# Patient Record
Sex: Male | Born: 1995
Health system: Southern US, Community
[De-identification: ages and names within clinical notes are randomized; demographics above are authoritative.]

## PROBLEM LIST (undated history)

## (undated) DIAGNOSIS — F909 Attention-deficit hyperactivity disorder, unspecified type: Secondary | ICD-10-CM

## (undated) DIAGNOSIS — Q625 Duplication of ureter: Secondary | ICD-10-CM

## (undated) HISTORY — PX: EYE MUSCLE SURGERY: SHX370

## (undated) HISTORY — PX: KNEE SURGERY: SHX244

---
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
## 2017-08-07 ENCOUNTER — Other Ambulatory Visit: Payer: Self-pay

## 2017-08-07 ENCOUNTER — Encounter (HOSPITAL_BASED_OUTPATIENT_CLINIC_OR_DEPARTMENT_OTHER): Payer: Self-pay | Admitting: *Deleted

## 2017-08-07 ENCOUNTER — Emergency Department (HOSPITAL_BASED_OUTPATIENT_CLINIC_OR_DEPARTMENT_OTHER): Payer: 59

## 2017-08-07 ENCOUNTER — Emergency Department (HOSPITAL_BASED_OUTPATIENT_CLINIC_OR_DEPARTMENT_OTHER)
Admission: EM | Admit: 2017-08-07 | Discharge: 2017-08-07 | Disposition: A | Discharge status: Home / Self Care | Payer: 59 | Attending: Emergency Medicine | Admitting: Emergency Medicine

## 2017-08-07 DIAGNOSIS — R1011 Right upper quadrant pain: Secondary | ICD-10-CM

## 2017-08-07 DIAGNOSIS — R112 Nausea with vomiting, unspecified: Secondary | ICD-10-CM | POA: Diagnosis not present

## 2017-08-07 DIAGNOSIS — Z79899 Other long term (current) drug therapy: Secondary | ICD-10-CM | POA: Diagnosis not present

## 2017-08-07 DIAGNOSIS — R079 Chest pain, unspecified: Secondary | ICD-10-CM

## 2017-08-07 HISTORY — DX: Attention-deficit hyperactivity disorder, unspecified type: F90.9

## 2017-08-07 HISTORY — DX: Duplication of ureter: Q62.5

## 2017-08-07 LAB — COMPREHENSIVE METABOLIC PANEL
ALK PHOS: 69 U/L (ref 38–126)
ALT: 43 U/L (ref 17–63)
AST: 28 U/L (ref 15–41)
Albumin: 4.7 g/dL (ref 3.5–5.0)
Anion gap: 8 (ref 5–15)
BILIRUBIN TOTAL: 0.7 mg/dL (ref 0.3–1.2)
BUN: 12 mg/dL (ref 6–20)
CALCIUM: 10 mg/dL (ref 8.9–10.3)
CO2: 28 mmol/L (ref 22–32)
CREATININE: 0.91 mg/dL (ref 0.61–1.24)
Chloride: 100 mmol/L — ABNORMAL LOW (ref 101–111)
Glucose, Bld: 93 mg/dL (ref 65–99)
Potassium: 4.1 mmol/L (ref 3.5–5.1)
SODIUM: 136 mmol/L (ref 135–145)
Total Protein: 8 g/dL (ref 6.5–8.1)

## 2017-08-07 LAB — CBC WITH DIFFERENTIAL/PLATELET
Basophils Absolute: 0 10*3/uL (ref 0.0–0.1)
Basophils Relative: 0 %
Eosinophils Absolute: 0.1 10*3/uL (ref 0.0–0.7)
Eosinophils Relative: 1 %
HEMATOCRIT: 46 % (ref 39.0–52.0)
HEMOGLOBIN: 15.9 g/dL (ref 13.0–17.0)
LYMPHS ABS: 3.1 10*3/uL (ref 0.7–4.0)
LYMPHS PCT: 27 %
MCH: 27.6 pg (ref 26.0–34.0)
MCHC: 34.6 g/dL (ref 30.0–36.0)
MCV: 79.7 fL (ref 78.0–100.0)
Monocytes Absolute: 1 10*3/uL (ref 0.1–1.0)
Monocytes Relative: 9 %
NEUTROS ABS: 7.3 10*3/uL (ref 1.7–7.7)
NEUTROS PCT: 63 %
Platelets: 250 10*3/uL (ref 150–400)
RBC: 5.77 MIL/uL (ref 4.22–5.81)
RDW: 13.4 % (ref 11.5–15.5)
WBC: 11.5 10*3/uL — AB (ref 4.0–10.5)

## 2017-08-07 LAB — TROPONIN I

## 2017-08-07 LAB — LIPASE, BLOOD: Lipase: 30 U/L (ref 11–51)

## 2017-08-07 MED ORDER — ONDANSETRON 4 MG PO TBDP: 4.0000 mg | ORAL_TABLET | Freq: Three times a day (TID) | ORAL | 0 refills | Status: DC | PRN

## 2017-08-07 MED ORDER — CYCLOBENZAPRINE HCL 10 MG PO TABS: 10.0000 mg | ORAL_TABLET | Freq: Two times a day (BID) | ORAL | 0 refills | Status: DC | PRN

## 2017-08-07 MED FILL — CYCLOBENZAPRINE 10 MG TAB: 10 | 3 days supply | Qty: 6 | Fill #0

## 2017-08-07 MED FILL — ONDANSETRON ODT 4 MG TABLET: 4 | 2 days supply | Qty: 6 | Fill #0

## 2017-08-07 NOTE — ED Provider Notes (Signed)
[facility] EMERGENCY DEPARTMENT Provider Note   CSN: [id number] Arrival date & time: 08/07/17  0911     History   Chief Complaint Chief Complaint  Patient presents with  . Chest Pain  . Emesis    HPI [name] is a 21 y.o. male.  HPI   Right side lower chest pain, woke up with it yesterday morning and it has been progressively getting worse.  Worse with movements and deep breaths.  Walking around worse.  More difficult sleeping on stomach.  No cough, no shortness of breath but does have pain with breathing.  Nausea and 3 episodes of vomiting.  Hasn't had anything to eat since 1230.  No fevers.  No leg pain or swelling, no immobilization.  [place] to [facility] last weekend.  Occasional etoh.  No drug use or cigarettes.   No family hx of CAD, no fam hx DVT  Past Medical History:  Diagnosis Date  . ADHD   . Duplicated renal collecting system     There are no active problems to display for this patient.   Past Surgical History:  Procedure Laterality Date  . EYE MUSCLE SURGERY Bilateral   . KNEE SURGERY Right        Home Medications    Prior to Admission medications   Medication Sig Start Date End Date Taking? Authorizing Provider  amphetamine-dextroamphetamine (ADDERALL XR) 30 MG 24 hr capsule Take 30 mg daily by mouth.   Yes [provider]  cyclobenzaprine (FLEXERIL) 10 MG tablet Take 1 tablet (10 mg total) 2 (two) times daily as needed by mouth for muscle spasms. 08/07/17   Alvira MondaySchlossman, Stancil Deisher, MD  ondansetron (ZOFRAN ODT) 4 MG disintegrating tablet Take 1 tablet (4 mg total) every 8 (eight) hours as needed by mouth for nausea or vomiting. 08/07/17   Alvira MondaySchlossman, Cyril Railey, MD    Family History No family history on file.  Social History Social History   Tobacco Use  . Smoking status: Never Smoker  . Smokeless tobacco: Never Used  Substance Use Topics  . Alcohol use: Yes    Comment: occ  . Drug use: Not on file     Allergies    Oxycodone   Review of Systems Review of Systems  Constitutional: Negative for fever.  HENT: Negative for sore throat.   Eyes: Negative for visual disturbance.  Respiratory: Negative for shortness of breath.   Cardiovascular: Positive for chest pain.  Gastrointestinal: Positive for abdominal pain, nausea and vomiting.  Genitourinary: Negative for difficulty urinating and dysuria.  Musculoskeletal: Negative for back pain and neck stiffness.  Skin: Negative for rash.  Neurological: Negative for syncope and headaches.     Physical Exam Updated Vital Signs BP 136/83   Pulse 73   Temp 98.5 F (36.9 C) (Oral)   Resp 14   Ht 6' (1.829 m)   Wt 111.1 kg (245 lb)   SpO2 100%   BMI 33.23 kg/m   Physical Exam  Constitutional: He is oriented to person, place, and time. He appears well-developed and well-nourished. No distress.  HENT:  Head: Normocephalic and atraumatic.  Eyes: Conjunctivae and EOM are normal.  Neck: Normal range of motion.  Cardiovascular: Normal rate, regular rhythm, normal heart sounds and intact distal pulses. Exam reveals no gallop and no friction rub.  No murmur heard. Pulmonary/Chest: Effort normal and breath sounds normal. No respiratory distress. He has no wheezes. He has no rales.  Abdominal: Soft. He exhibits no distension. There is tenderness (epigastric  and RUQ). There is no guarding.  Musculoskeletal: He exhibits no edema.  Neurological: He is alert and oriented to person, place, and time.  Skin: Skin is warm and dry. He is not diaphoretic.  Nursing note and vitals reviewed.    ED Treatments / Results  Labs (all labs ordered are listed, but only abnormal results are displayed) Labs Reviewed  CBC WITH DIFFERENTIAL/PLATELET - Abnormal; Notable for the following components:      Result Value   WBC 11.5 (*)    All other components within normal limits  COMPREHENSIVE METABOLIC PANEL - Abnormal; Notable for the following components:   Chloride 100  (*)    All other components within normal limits  LIPASE, BLOOD  TROPONIN I    EKG  EKG Interpretation  Date/Time:  Thursday August 07 2017 09:42:27 EST Ventricular Rate:  81 PR Interval:    QRS Duration: 91 QT Interval:  335 QTC Calculation: 389 R Axis:   52 Text Interpretation:  Sinus rhythm ST elev, probable normal early repol pattern No previous ECGs available Confirmed by Alvira MondaySchlossman, Lacy Taglieri ([id number]) on 08/07/2017 11:29:45 AM       Radiology Dg Chest 2 View  Result Date: 08/07/2017 CLINICAL DATA:  Onset of anterior right rib pain yesterday. No known injury. EXAM: CHEST  2 VIEW COMPARISON:  None. FINDINGS: Lungs are clear. Heart size is normal. No pneumothorax or pleural fluid. No bony abnormality. IMPRESSION: Negative chest. Electronically Signed   By: Drusilla Kannerhomas  Dalessio M.D.   On: 08/07/2017 10:22   [place] Abdomen Limited Ruq  Result Date: 08/07/2017 CLINICAL DATA:  Onset of right upper quadrant pain yesterday with increasing severity and development of vomiting last night. Patient also reports right lower anterior ribcage pain tender to palpation and related to deep breathing. History of previous kidney surgery. EXAM: ULTRASOUND ABDOMEN LIMITED RIGHT UPPER QUADRANT COMPARISON:  None in PACs FINDINGS: Gallbladder: No gallstones or wall thickening visualized. No sonographic Murphy sign noted by sonographer. Common bile duct: Diameter: 4.5 mm.  Visualization was limited however. Liver: The hepatic echotexture is increased. There is no focal mass or ductal dilation. The surface contour is smooth. Portal vein is patent on color Doppler imaging with normal direction of blood flow towards the liver. IMPRESSION: Normal appearance of the gallbladder and common bile duct. Mildly increased hepatic echotexture most compatible with fatty infiltrative change. Electronically Signed   By: David  SwazilandJordan M.D.   On: 08/07/2017 10:46    Procedures Procedures (including critical care time)  Medications  Ordered in ED Medications - No data to display   Initial Impression / Assessment and Plan / ED Course  I have reviewed the triage vital signs and the nursing notes.  Pertinent labs & imaging results that were available during my care of the patient were reviewed by me and considered in my medical decision making (see chart for details).     21yo male presents with concern for right lower chest and right upper abdominal pain.  He is PERC negative, doubt PE.  Troponin negative, doubt myocarditis or PE. EKG without acute changes.  CXR WNL.  RUQ US shows fatty liver, no other acute changes.  Lipase and transaminases WNL.  Suspect most likely musculoskeletal etiology of pain. Patient discharged in stable condition with understanding of reasons to return.   Final Clinical Impressions(s) / ED Diagnoses   Final diagnoses:  Right upper quadrant abdominal pain  Chest pain, unspecified type    ED Discharge Orders  Ordered    cyclobenzaprine (FLEXERIL) 10 MG tablet  2 times daily PRN     08/07/17 1131    ondansetron (ZOFRAN ODT) 4 MG disintegrating tablet  Every 8 hours PRN     08/07/17 1131       Alvira Monday, MD 08/08/17 1355

## 2017-08-07 NOTE — ED Triage Notes (Signed)
Pt reports waking up yesterday morning with anterior R lower rib cage pain. Reports worse with palpation and deep breathing. Denies known injury/heavy lifting. Denies sob. Reports emesis x2 last night. Denies nausea, diarrhea, fever.

## 2019-02-14 IMAGING — CR DG CHEST 2V
2 series · 2 of 2 positions shown · non-contrast
Comparison: None.

CLINICAL DATA: Onset of anterior right rib pain yesterday. No known
injury.

EXAM:
CHEST  2 VIEW

[w chest pa]
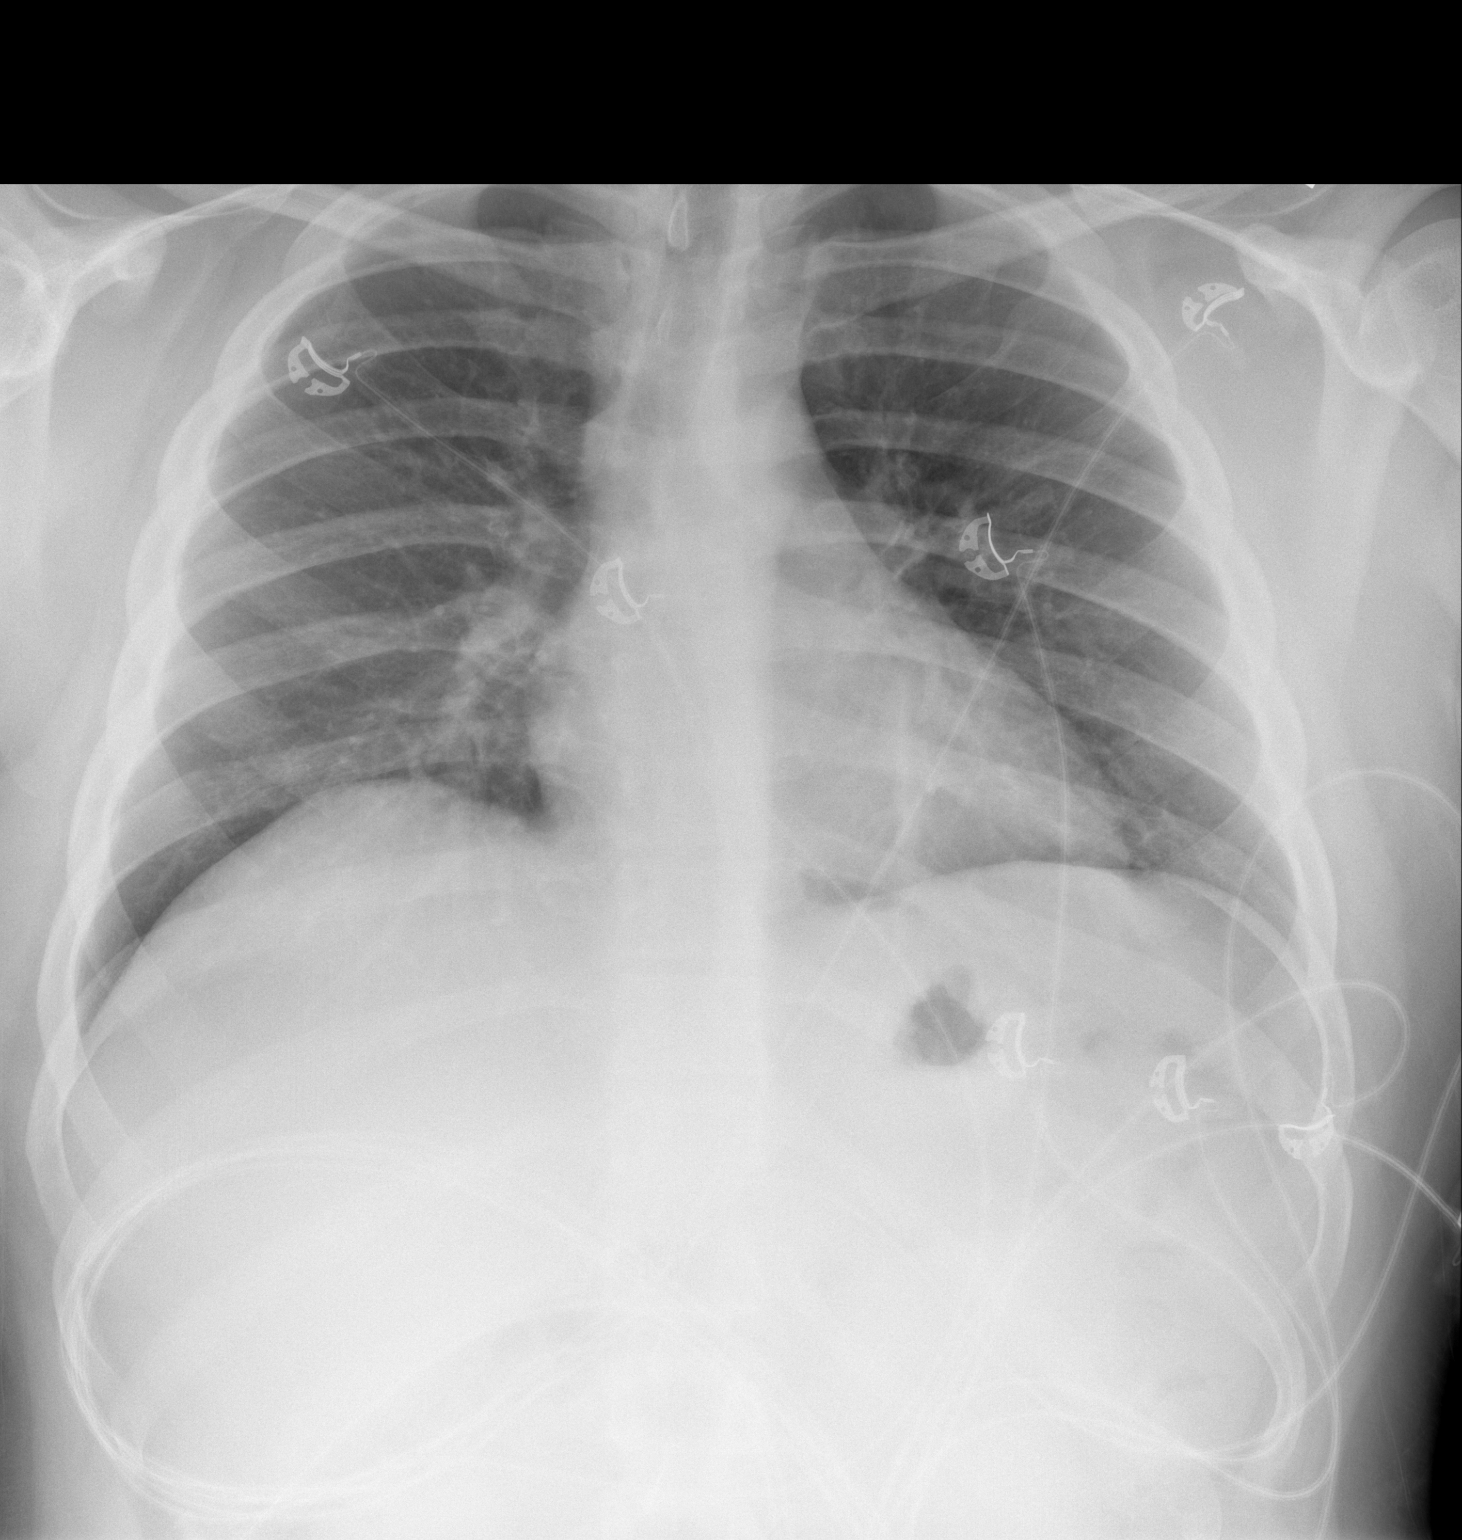

[w chest lat]
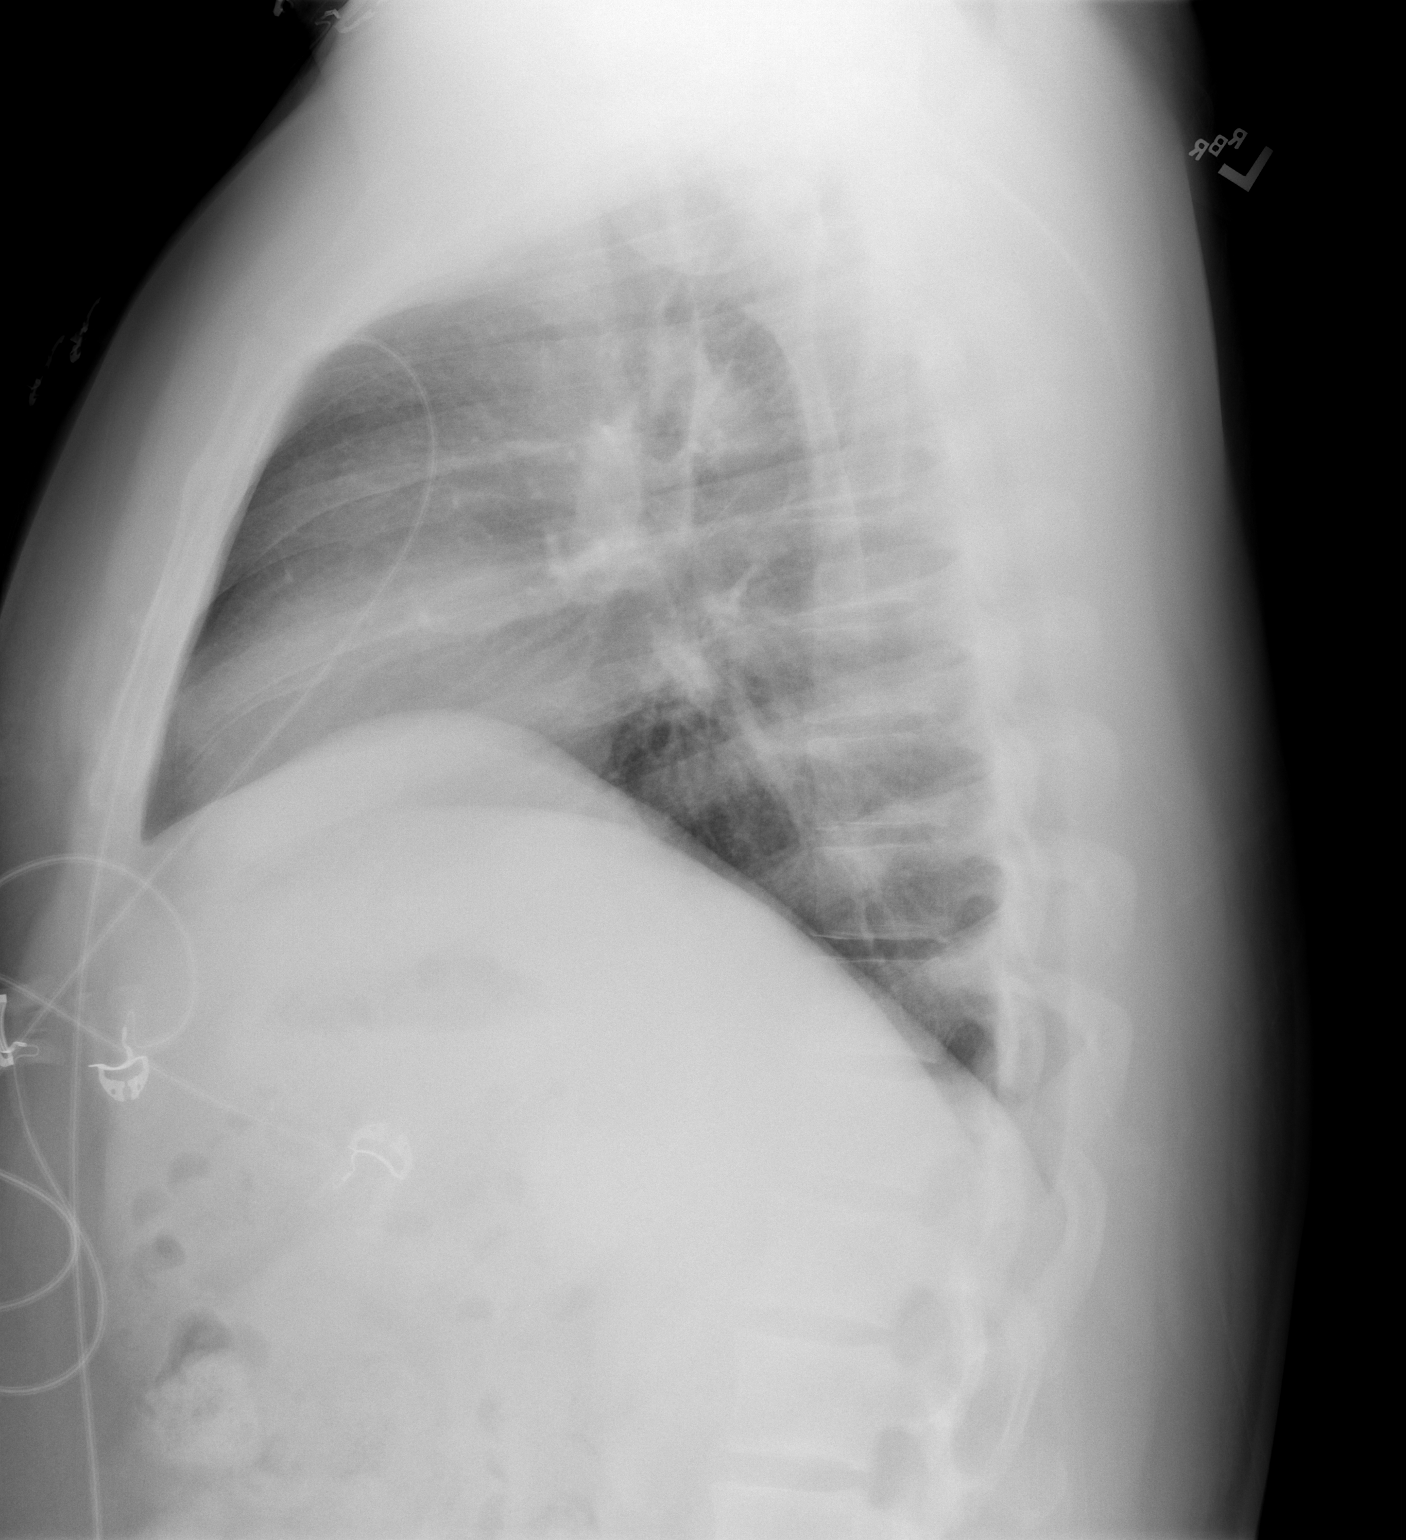

[2 of 2 positions shown; findings below may reference images not displayed]

FINDINGS: Lungs are clear. Heart size is normal. No pneumothorax or pleural
fluid. No bony abnormality.
IMPRESSION: Negative chest.

## 2019-11-12 ENCOUNTER — Ambulatory Visit: Payer: Self-pay | Admitting: Ophthalmology

## 2019-11-19 ENCOUNTER — Encounter (HOSPITAL_BASED_OUTPATIENT_CLINIC_OR_DEPARTMENT_OTHER): Payer: Self-pay | Admitting: Ophthalmology

## 2019-11-19 ENCOUNTER — Other Ambulatory Visit: Payer: Self-pay

## 2019-11-23 ENCOUNTER — Other Ambulatory Visit (HOSPITAL_COMMUNITY): Payer: 59

## 2019-11-26 ENCOUNTER — Other Ambulatory Visit: Payer: Self-pay

## 2019-11-26 ENCOUNTER — Ambulatory Visit (HOSPITAL_BASED_OUTPATIENT_CLINIC_OR_DEPARTMENT_OTHER): Payer: 59 | Admitting: Anesthesiology

## 2019-11-26 ENCOUNTER — Encounter (HOSPITAL_BASED_OUTPATIENT_CLINIC_OR_DEPARTMENT_OTHER)
Admission: RE | Disposition: A | Discharge status: Home / Self Care | Payer: Self-pay | Source: Home / Self Care | Attending: Ophthalmology

## 2019-11-26 ENCOUNTER — Encounter (HOSPITAL_BASED_OUTPATIENT_CLINIC_OR_DEPARTMENT_OTHER): Payer: Self-pay | Admitting: Ophthalmology

## 2019-11-26 ENCOUNTER — Ambulatory Visit (HOSPITAL_BASED_OUTPATIENT_CLINIC_OR_DEPARTMENT_OTHER)
Admission: RE | Admit: 2019-11-26 | Discharge: 2019-11-26 | Disposition: A | Discharge status: Home / Self Care | Payer: 59 | Attending: Ophthalmology | Admitting: Ophthalmology

## 2019-11-26 DIAGNOSIS — Q630 Accessory kidney: Secondary | ICD-10-CM | POA: Insufficient documentation

## 2019-11-26 DIAGNOSIS — F909 Attention-deficit hyperactivity disorder, unspecified type: Secondary | ICD-10-CM | POA: Diagnosis not present

## 2019-11-26 DIAGNOSIS — H5111 Convergence insufficiency: Secondary | ICD-10-CM | POA: Insufficient documentation

## 2019-11-26 DIAGNOSIS — H5010 Unspecified exotropia: Secondary | ICD-10-CM | POA: Insufficient documentation

## 2019-11-26 HISTORY — PX: STRABISMUS SURGERY: SHX218

## 2019-11-26 SURGERY — REPAIR STRABISMUS
Anesthesia: General | Site: Eye | Laterality: Bilateral

## 2019-11-26 MED ORDER — DEXAMETHASONE SODIUM PHOSPHATE 10 MG/ML IJ SOLN
INTRAMUSCULAR | Status: AC
  Filled 2019-11-26: qty 1

## 2019-11-26 MED ORDER — DEXAMETHASONE SODIUM PHOSPHATE 4 MG/ML IJ SOLN
INTRAMUSCULAR | Status: DC | PRN
  Administered 2019-11-26: 10 mg via INTRAVENOUS

## 2019-11-26 MED ORDER — LACTATED RINGERS IV SOLN: INTRAVENOUS | Status: DC

## 2019-11-26 MED ORDER — EPHEDRINE 5 MG/ML INJ
INTRAVENOUS | Status: AC
  Filled 2019-11-26: qty 10

## 2019-11-26 MED ORDER — PROPOFOL 500 MG/50ML IV EMUL
INTRAVENOUS | Status: AC
  Filled 2019-11-26: qty 50

## 2019-11-26 MED ORDER — FENTANYL CITRATE (PF) 100 MCG/2ML IJ SOLN
INTRAMUSCULAR | Status: AC
  Filled 2019-11-26: qty 2

## 2019-11-26 MED ORDER — KETOROLAC TROMETHAMINE 30 MG/ML IJ SOLN
INTRAMUSCULAR | Status: DC | PRN
  Administered 2019-11-26: 30 mg via INTRAVENOUS

## 2019-11-26 MED ORDER — ONDANSETRON HCL 4 MG/2ML IJ SOLN
INTRAMUSCULAR | Status: DC | PRN
  Administered 2019-11-26: 4 mg via INTRAVENOUS

## 2019-11-26 MED ORDER — PROMETHAZINE HCL 25 MG/ML IJ SOLN: 6.2500 mg | INTRAMUSCULAR | Status: DC | PRN

## 2019-11-26 MED ORDER — ACETAMINOPHEN 10 MG/ML IV SOLN
1000.0000 mg | Freq: Once | INTRAVENOUS | Status: DC | PRN
  Administered 2019-11-26: 1000 mg via INTRAVENOUS

## 2019-11-26 MED ORDER — FENTANYL CITRATE (PF) 100 MCG/2ML IJ SOLN
INTRAMUSCULAR | Status: DC | PRN
  Administered 2019-11-26: 100 ug via INTRAVENOUS
  Administered 2019-11-26 (×2): 50 ug via INTRAVENOUS

## 2019-11-26 MED ORDER — PHENYLEPHRINE 40 MCG/ML (10ML) SYRINGE FOR IV PUSH (FOR BLOOD PRESSURE SUPPORT)
PREFILLED_SYRINGE | INTRAVENOUS | Status: AC
  Filled 2019-11-26: qty 10

## 2019-11-26 MED ORDER — ACETAMINOPHEN 10 MG/ML IV SOLN
INTRAVENOUS | Status: AC
  Filled 2019-11-26: qty 100

## 2019-11-26 MED ORDER — LIDOCAINE HCL (CARDIAC) PF 100 MG/5ML IV SOSY
PREFILLED_SYRINGE | INTRAVENOUS | Status: DC | PRN
  Administered 2019-11-26: 80 mg via INTRAVENOUS

## 2019-11-26 MED ORDER — TOBRAMYCIN-DEXAMETHASONE 0.3-0.1 % OP OINT
TOPICAL_OINTMENT | OPHTHALMIC | Status: DC | PRN
  Administered 2019-11-26: 1 via OPHTHALMIC

## 2019-11-26 MED ORDER — FENTANYL CITRATE (PF) 100 MCG/2ML IJ SOLN
25.0000 ug | INTRAMUSCULAR | Status: DC | PRN
  Administered 2019-11-26: 50 ug via INTRAVENOUS

## 2019-11-26 MED ORDER — IBUPROFEN 800 MG PO TABS
800.0000 mg | ORAL_TABLET | Freq: Once | ORAL | Status: AC
  Administered 2019-11-26: 800 mg via ORAL

## 2019-11-26 MED ORDER — GLYCOPYRROLATE 0.2 MG/ML IJ SOLN
INTRAMUSCULAR | Status: DC | PRN
  Administered 2019-11-26: .2 mg via INTRAVENOUS

## 2019-11-26 MED ORDER — LIDOCAINE 2% (20 MG/ML) 5 ML SYRINGE
INTRAMUSCULAR | Status: AC
  Filled 2019-11-26: qty 5

## 2019-11-26 MED ORDER — MEPERIDINE HCL 25 MG/ML IJ SOLN: 6.2500 mg | INTRAMUSCULAR | Status: DC | PRN

## 2019-11-26 MED ORDER — IBUPROFEN 200 MG PO TABS
ORAL_TABLET | ORAL | Status: AC
  Filled 2019-11-26: qty 4

## 2019-11-26 MED ORDER — MIDAZOLAM HCL 5 MG/5ML IJ SOLN
INTRAMUSCULAR | Status: DC | PRN
  Administered 2019-11-26: 2 mg via INTRAVENOUS

## 2019-11-26 MED ORDER — KETOROLAC TROMETHAMINE 30 MG/ML IJ SOLN
INTRAMUSCULAR | Status: AC
  Filled 2019-11-26: qty 1

## 2019-11-26 MED ORDER — ONDANSETRON HCL 4 MG/2ML IJ SOLN
INTRAMUSCULAR | Status: AC
  Filled 2019-11-26: qty 2

## 2019-11-26 MED ORDER — ACETAMINOPHEN 160 MG/5ML PO SOLN: 325.0000 mg | Freq: Once | ORAL | Status: DC | PRN

## 2019-11-26 MED ORDER — SUCCINYLCHOLINE CHLORIDE 200 MG/10ML IV SOSY
PREFILLED_SYRINGE | INTRAVENOUS | Status: AC
  Filled 2019-11-26: qty 10

## 2019-11-26 MED ORDER — ACETAMINOPHEN 325 MG PO TABS: 325.0000 mg | ORAL_TABLET | Freq: Once | ORAL | Status: DC | PRN

## 2019-11-26 MED ORDER — MIDAZOLAM HCL 2 MG/2ML IJ SOLN
INTRAMUSCULAR | Status: AC
  Filled 2019-11-26: qty 2

## 2019-11-26 SURGICAL SUPPLY — 33 items
APL SRG 3 HI ABS STRL LF PLS (MISCELLANEOUS) ×1
APL SWBSTK 6 STRL LF DISP (MISCELLANEOUS) ×4
APPLICATOR COTTON TIP 6 STRL (MISCELLANEOUS) ×4 IMPLANT
APPLICATOR COTTON TIP 6IN STRL (MISCELLANEOUS) ×8
APPLICATOR DR MATTHEWS STRL (MISCELLANEOUS) ×2 IMPLANT
BNDG EYE OVAL (GAUZE/BANDAGES/DRESSINGS) IMPLANT
CAUTERY EYE LOW TEMP 1300F FIN (OPHTHALMIC RELATED) IMPLANT
COVER BACK TABLE 60X90IN (DRAPES) ×2 IMPLANT
COVER MAYO STAND STRL (DRAPES) ×2 IMPLANT
COVER WAND RF STERILE (DRAPES) IMPLANT
DRAPE SURG 17X23 STRL (DRAPES) ×4 IMPLANT
DRAPE U-SHAPE 76X120 STRL (DRAPES) ×1 IMPLANT
GLOVE BIO SURGEON STRL SZ 6.5 (GLOVE) ×4 IMPLANT
GLOVE BIOGEL M STRL SZ7.5 (GLOVE) ×2 IMPLANT
GOWN STRL REUS W/ TWL LRG LVL3 (GOWN DISPOSABLE) ×1 IMPLANT
GOWN STRL REUS W/TWL LRG LVL3 (GOWN DISPOSABLE) ×2
GOWN STRL REUS W/TWL XL LVL3 (GOWN DISPOSABLE) ×2 IMPLANT
NS IRRIG 1000ML POUR BTL (IV SOLUTION) ×2 IMPLANT
PACK BASIN DAY SURGERY FS (CUSTOM PROCEDURE TRAY) ×2 IMPLANT
SHEET MEDIUM DRAPE 40X70 STRL (DRAPES) IMPLANT
SPEAR EYE SURG WECK-CEL (MISCELLANEOUS) ×4 IMPLANT
STRIP CLOSURE SKIN 1/4X4 (GAUZE/BANDAGES/DRESSINGS) IMPLANT
SUT 6 0 SILK T G140 8DA (SUTURE) IMPLANT
SUT MERSILENE 6-0 18IN S14 8MM (SUTURE)
SUT PLAIN 6 0 TG1408 (SUTURE) ×1 IMPLANT
SUT SILK 4 0 C 3 735G (SUTURE) IMPLANT
SUT VICRYL 6 0 S 28 (SUTURE) ×2 IMPLANT
SUT VICRYL ABS 6-0 S29 18IN (SUTURE) IMPLANT
SUTURE MERSLN 6-0 18IN S14 8MM (SUTURE) IMPLANT
SYR 10ML LL (SYRINGE) ×2 IMPLANT
SYR TB 1ML LL NO SAFETY (SYRINGE) ×2 IMPLANT
TOWEL GREEN STERILE FF (TOWEL DISPOSABLE) ×2 IMPLANT
TRAY DSU PREP LF (CUSTOM PROCEDURE TRAY) ×2 IMPLANT

## 2019-11-26 NOTE — Interval H&P Note (Signed)
History and Physical Interval Note:  11/26/2019 9:31 AM  Para Skeans  has presented today for surgery, with the diagnosis of EXOTROPIA.  The various methods of treatment have been discussed with the patient and family. After consideration of risks, benefits and other options for treatment, the patient has consented to  Procedure(s): REPAIR STRABISMUS  BOTH EYES (Bilateral) as a surgical intervention.  The patient's history has been reviewed, patient examined, no change in status, stable for surgery.  I have reviewed the patient's chart and labs.  Questions were answered to the patient's satisfaction.     Shara Blazing

## 2019-11-26 NOTE — Anesthesia Procedure Notes (Signed)
Procedure Name: LMA Insertion Date/Time: 11/26/2019 10:03 AM Performed by: Ronnette Hila, CRNA Pre-anesthesia Checklist: Patient identified, Emergency Drugs available, Suction available and Patient being monitored Patient Re-evaluated:Patient Re-evaluated prior to induction Oxygen Delivery Method: Circle system utilized Preoxygenation: Pre-oxygenation with 100% oxygen Induction Type: IV induction Ventilation: Mask ventilation without difficulty LMA: LMA inserted LMA Size: 5.0 Number of attempts: 1 Airway Equipment and Method: Bite block Placement Confirmation: positive ETCO2 Tube secured with: Tape Dental Injury: Teeth and Oropharynx as per pre-operative assessment

## 2019-11-26 NOTE — Transfer of Care (Signed)
Immediate Anesthesia Transfer of Care Note  Patient: Chad Ellison  Procedure(s) Performed: STRABISMUS  REPAIR BOTH EYES (Bilateral Eye)  Patient Location: PACU  Anesthesia Type:General  Level of Consciousness: awake, alert , oriented and drowsy  Airway & Oxygen Therapy: Patient Spontanous Breathing and Patient connected to nasal cannula oxygen  Post-op Assessment: Report given to RN and Post -op Vital signs reviewed and stable  Post vital signs: Reviewed and stable  Last Vitals:  Vitals Value Taken Time  BP    Temp    Pulse 115 11/26/19 1132  Resp 25 11/26/19 1132  SpO2 96 % 11/26/19 1132  Vitals shown include unvalidated device data.  Last Pain:  Vitals:   11/26/19 0838  TempSrc: Tympanic  PainSc: 0-No pain      Patients Stated Pain Goal: 5 (11/26/19 9980)  Complications: No apparent anesthesia complications

## 2019-11-26 NOTE — Anesthesia Postprocedure Evaluation (Signed)
Anesthesia Post Note  Patient: Chad Ellison  Procedure(s) Performed: STRABISMUS  REPAIR BOTH EYES (Bilateral Eye)     Patient location during evaluation: PACU Anesthesia Type: General Level of consciousness: awake and alert Pain management: pain level controlled Vital Signs Assessment: post-procedure vital signs reviewed and stable Respiratory status: spontaneous breathing, nonlabored ventilation, respiratory function stable and patient connected to nasal cannula oxygen Cardiovascular status: blood pressure returned to baseline and stable Postop Assessment: no apparent nausea or vomiting Anesthetic complications: no    Last Vitals:  Vitals:   11/26/19 1215 11/26/19 1230  BP: 136/72 135/82  Pulse: 91 85  Resp: 13 16  Temp:  36.9 C  SpO2: 96% 98%             Shelton Silvas

## 2019-11-26 NOTE — Anesthesia Preprocedure Evaluation (Addendum)
Anesthesia Evaluation  Patient identified by MRN, date of birth, ID band Patient awake    Reviewed: Allergy & Precautions, NPO status , Patient's Chart, lab work & pertinent test results  Airway Mallampati: III  TM Distance: >3 FB Neck ROM: Full    Dental  (+) Teeth Intact, Dental Advisory Given   Pulmonary neg pulmonary ROS,    breath sounds clear to auscultation       Cardiovascular negative cardio ROS   Rhythm:Regular Rate:Normal     Neuro/Psych PSYCHIATRIC DISORDERS negative neurological ROS     GI/Hepatic negative GI ROS, Neg liver ROS,   Endo/Other  negative endocrine ROS  Renal/GU      Musculoskeletal negative musculoskeletal ROS (+)   Abdominal Normal abdominal exam  (+)   Peds  Hematology negative hematology ROS (+)   Anesthesia Other Findings   Reproductive/Obstetrics                            Anesthesia Physical Anesthesia Plan  ASA: II  Anesthesia Plan: General   Post-op Pain Management:    Induction: Intravenous  PONV Risk Score and Plan: 3 and Ondansetron, Dexamethasone and Midazolam  Airway Management Planned: Oral ETT and LMA  Additional Equipment: None  Intra-op Plan:   Post-operative Plan: Extubation in OR  Informed Consent: I have reviewed the patients History and Physical, chart, labs and discussed the procedure including the risks, benefits and alternatives for the proposed anesthesia with the patient or authorized representative who has indicated his/her understanding and acceptance.       Plan Discussed with: CRNA  Anesthesia Plan Comments:        Anesthesia Quick Evaluation

## 2019-11-26 NOTE — Op Note (Signed)
11/26/2019  11:29 AM  PATIENT:  Chad Ellison    PRE-OPERATIVE DIAGNOSIS:  Exotropia, residual/recurrent  POST-OPERATIVE DIAGNOSIS:  Exotropia, residual/recurrent  PROCEDURE:  Medial rectus muscle resection, 5.0 mm both eyes  SURGEON:  Shara Blazing, MD  ANESTHESIA:   General  COMPLICATIONS: none  OPERATIVE PROCEDURE: After routine preoperative evaluation including informed consent from the parent, the patient was taken to the operative room where He was identified by me. General anesthesia was induced without difficulty after placement of appropriate monitors. The patient was prepped and draped in standard sterile fashion. A lid speculum was placed in the right eye.  Through an inferonasal fornix incision through conjunctiva and Tenon's fascia, the previously resected right medial rectus muscle was engaged on a series of muscle hooks and carefully cleared of its fascial attachments and scar tissue to at least 15 mm posterior to the insertion. The muscle was spread between 2 self-retaining hooks. A 2 mm bite was taken of the center of the muscle belly at a measured distance of 5.0 mm posterior to the insertion, and a knot was tied securely at this location. The needle at each end of the double-armed suture was passed from the center of the muscle belly to the periphery, parallel to and 5.0 mm posterior to the insertion. A locking bite was placed at each border of the muscle. A resection clamp was placed on the muscle just anterior to the sutures. The muscle was disinserted. Each pole suture was passed posteriorly to anteriorly through the corresponding end of the muscle stump, then anteriorly to posteriorly near the center of the stump, then posteriorly to anteriorly through the center of the muscle belly, just posterior to the previously placed knot. The muscle was drawn up to the level of the original insertion. The clamp was removed. The suture ends were tied securely after all slack had  been removed. The portion of the muscle anterior to the sutures was carefully excised. Conjunctiva was closed with a single 6-0 plain gut suture.  The speculum was transferred to the left eye, where an identical procedure was performed, again effecting a 5.0 mm re-resection of the previously resected medial rectus muscle. Tobradex ophthalmic ointment was placed in  both eyes. The patient was awakened without difficulty and taken to the recovery room in stable condition, having suffered no intraoperative or immediate postoperative competitions.  Shara Blazing, MD

## 2019-11-26 NOTE — Discharge Instructions (Signed)
  No tylenol until after 8pm tonight    Use Tobradex eye ointment twice daily in both eyes until seen by Dr. Maple Hudson for followup.  Use cool compresses as needed for comfort.  Use ibuprofen 800mg  every 12 hours as needed for discomfort.    Post Anesthesia Home Care Instructions  Activity: Get plenty of rest for the remainder of the day. A responsible individual must stay with you for 24 hours following the procedure.  For the next 24 hours, DO NOT: -Drive a car - -Drink alcoholic beverages -Take any medication unless instructed by your physician -Make any legal decisions or sign important papers.  Meals: Start with liquid foods such as gelatin or soup. Progress to regular foods as tolerated. Avoid greasy, spicy, heavy foods. If nausea and/or vomiting occur, drink only clear liquids until the nausea and/or vomiting subsides. Call your physician if vomiting continues.  Special Instructions/Symptoms: Your throat may feel dry or sore from the anesthesia or the breathing tube placed in your throat during surgery. If this causes discomfort, gargle with warm salt water. The discomfort should disappear within 24 hours.  If you had a scopolamine patch placed behind your ear for the management of post- operative nausea and/or vomiting:  1. The medication in the patch is effective for 72 hours, after which it should be removed.  Wrap patch in a tissue and discard in the trash. Wash hands thoroughly with soap and water. 2. You may remove the patch earlier than 72 hours if you experience unpleasant side effects which may include dry mouth, dizziness or visual disturbances. 3. Avoid touching the patch. Wash your hands with soap and water after contact with the patch.

## 2019-11-26 NOTE — H&P (Signed)
Date of examination:  11-02-19  Indication for surgery: recurrent exotropia s/p strabismus surgery x 2  Pertinent past medical history:  Past Medical History:  Diagnosis Date  . ADHD   . 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  Now drifting out again  Pertinent family history: History reviewed. No pertinent family history.  General:  Healthy appearing patient in no distress.    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
